# Patient Record
Sex: Female | Born: 1935 | Race: Black or African American | Hispanic: No | Smoking: Never smoker
Health system: Southern US, Community
[De-identification: ages and names within clinical notes are randomized; demographics above are authoritative.]

## PROBLEM LIST (undated history)

## (undated) DIAGNOSIS — I10 Essential (primary) hypertension: Secondary | ICD-10-CM

## (undated) DIAGNOSIS — H9190 Unspecified hearing loss, unspecified ear: Secondary | ICD-10-CM

## (undated) DIAGNOSIS — E7800 Pure hypercholesterolemia, unspecified: Secondary | ICD-10-CM

## (undated) DIAGNOSIS — E119 Type 2 diabetes mellitus without complications: Secondary | ICD-10-CM

## (undated) DIAGNOSIS — M1990 Unspecified osteoarthritis, unspecified site: Secondary | ICD-10-CM

## (undated) HISTORY — PX: ABDOMINAL HYSTERECTOMY: SHX81

## (undated) HISTORY — PX: CATARACT EXTRACTION W/ INTRAOCULAR LENS IMPLANT: SHX1309

## (undated) HISTORY — PX: COLONOSCOPY: SHX174

---

## 2005-04-24 ENCOUNTER — Ambulatory Visit: Payer: Self-pay | Admitting: Gastroenterology

## 2005-11-12 ENCOUNTER — Ambulatory Visit: Payer: Self-pay | Admitting: Family Medicine

## 2006-07-21 ENCOUNTER — Ambulatory Visit: Payer: Self-pay | Admitting: Family Medicine

## 2006-12-20 ENCOUNTER — Ambulatory Visit: Payer: Self-pay | Admitting: Family Medicine

## 2007-12-22 ENCOUNTER — Ambulatory Visit: Payer: Self-pay | Admitting: Internal Medicine

## 2008-04-26 ENCOUNTER — Ambulatory Visit: Payer: Self-pay | Admitting: Gastroenterology

## 2009-02-05 ENCOUNTER — Ambulatory Visit: Payer: Self-pay | Admitting: Internal Medicine

## 2009-03-03 ENCOUNTER — Ambulatory Visit: Payer: Self-pay | Admitting: Internal Medicine

## 2010-02-27 ENCOUNTER — Ambulatory Visit: Payer: Self-pay | Admitting: Family Medicine

## 2011-03-03 ENCOUNTER — Ambulatory Visit: Payer: Self-pay | Admitting: Family Medicine

## 2011-03-08 ENCOUNTER — Ambulatory Visit: Payer: Self-pay | Admitting: Family Medicine

## 2011-03-10 ENCOUNTER — Ambulatory Visit: Payer: Self-pay | Admitting: Family Medicine

## 2011-04-07 ENCOUNTER — Ambulatory Visit: Payer: Self-pay | Admitting: Family Medicine

## 2011-05-08 ENCOUNTER — Ambulatory Visit: Payer: Self-pay | Admitting: Family Medicine

## 2011-11-14 ENCOUNTER — Ambulatory Visit: Payer: Self-pay

## 2012-03-17 ENCOUNTER — Ambulatory Visit: Payer: Self-pay | Admitting: Family Medicine

## 2013-03-23 ENCOUNTER — Ambulatory Visit: Payer: Self-pay | Admitting: Family Medicine

## 2014-03-27 ENCOUNTER — Ambulatory Visit: Payer: Self-pay | Admitting: Family Medicine

## 2014-05-09 ENCOUNTER — Ambulatory Visit: Payer: Self-pay | Admitting: Ophthalmology

## 2015-01-24 ENCOUNTER — Ambulatory Visit: Payer: Self-pay | Admitting: Family Medicine

## 2015-04-04 ENCOUNTER — Ambulatory Visit
Admit: 2015-04-04 | Disposition: A | Discharge status: Home / Self Care | Payer: Self-pay | Attending: Family Medicine | Admitting: Family Medicine

## 2015-07-23 ENCOUNTER — Other Ambulatory Visit: Payer: Self-pay | Admitting: Family Medicine

## 2015-07-23 DIAGNOSIS — E785 Hyperlipidemia, unspecified: Secondary | ICD-10-CM

## 2015-07-30 ENCOUNTER — Other Ambulatory Visit: Payer: Self-pay | Admitting: Family Medicine

## 2015-07-30 DIAGNOSIS — E119 Type 2 diabetes mellitus without complications: Secondary | ICD-10-CM

## 2015-09-02 ENCOUNTER — Other Ambulatory Visit: Payer: Self-pay | Admitting: Family Medicine

## 2015-09-02 DIAGNOSIS — E119 Type 2 diabetes mellitus without complications: Secondary | ICD-10-CM

## 2015-09-26 ENCOUNTER — Other Ambulatory Visit: Payer: Self-pay | Admitting: Family Medicine

## 2015-10-30 ENCOUNTER — Other Ambulatory Visit: Payer: Self-pay | Admitting: Family Medicine

## 2015-11-25 ENCOUNTER — Other Ambulatory Visit: Payer: Self-pay | Admitting: Family Medicine

## 2015-12-23 ENCOUNTER — Other Ambulatory Visit: Payer: Self-pay | Admitting: Family Medicine

## 2016-01-03 ENCOUNTER — Other Ambulatory Visit: Payer: Self-pay | Admitting: Family Medicine

## 2016-04-15 ENCOUNTER — Encounter: Payer: Self-pay | Admitting: *Deleted

## 2016-04-16 NOTE — Discharge Instructions (Signed)

## 2016-04-20 ENCOUNTER — Encounter: Payer: Self-pay | Admitting: *Deleted

## 2016-04-20 ENCOUNTER — Ambulatory Visit: Payer: Commercial Managed Care - HMO | Admitting: Anesthesiology

## 2016-04-20 ENCOUNTER — Ambulatory Visit
Admission: RE | Admit: 2016-04-20 | Discharge: 2016-04-20 | Disposition: A | Discharge status: Home / Self Care | Payer: Commercial Managed Care - HMO | Source: Ambulatory Visit | Attending: Ophthalmology | Admitting: Ophthalmology

## 2016-04-20 ENCOUNTER — Encounter
Admission: RE | Disposition: A | Discharge status: Home / Self Care | Payer: Self-pay | Source: Ambulatory Visit | Attending: Ophthalmology

## 2016-04-20 DIAGNOSIS — H269 Unspecified cataract: Secondary | ICD-10-CM | POA: Diagnosis present

## 2016-04-20 DIAGNOSIS — Z7984 Long term (current) use of oral hypoglycemic drugs: Secondary | ICD-10-CM | POA: Insufficient documentation

## 2016-04-20 DIAGNOSIS — E119 Type 2 diabetes mellitus without complications: Secondary | ICD-10-CM | POA: Insufficient documentation

## 2016-04-20 DIAGNOSIS — M1990 Unspecified osteoarthritis, unspecified site: Secondary | ICD-10-CM | POA: Diagnosis not present

## 2016-04-20 DIAGNOSIS — Z90710 Acquired absence of both cervix and uterus: Secondary | ICD-10-CM | POA: Insufficient documentation

## 2016-04-20 DIAGNOSIS — Z79899 Other long term (current) drug therapy: Secondary | ICD-10-CM | POA: Insufficient documentation

## 2016-04-20 DIAGNOSIS — Z7982 Long term (current) use of aspirin: Secondary | ICD-10-CM | POA: Diagnosis not present

## 2016-04-20 DIAGNOSIS — I10 Essential (primary) hypertension: Secondary | ICD-10-CM | POA: Insufficient documentation

## 2016-04-20 DIAGNOSIS — Z9841 Cataract extraction status, right eye: Secondary | ICD-10-CM | POA: Diagnosis not present

## 2016-04-20 DIAGNOSIS — E7800 Pure hypercholesterolemia, unspecified: Secondary | ICD-10-CM | POA: Insufficient documentation

## 2016-04-20 DIAGNOSIS — H2512 Age-related nuclear cataract, left eye: Secondary | ICD-10-CM | POA: Insufficient documentation

## 2016-04-20 HISTORY — DX: Pure hypercholesterolemia, unspecified: E78.00

## 2016-04-20 HISTORY — DX: Unspecified osteoarthritis, unspecified site: M19.90

## 2016-04-20 HISTORY — DX: Essential (primary) hypertension: I10

## 2016-04-20 HISTORY — PX: CATARACT EXTRACTION W/PHACO: SHX586

## 2016-04-20 HISTORY — DX: Type 2 diabetes mellitus without complications: E11.9

## 2016-04-20 HISTORY — DX: Unspecified hearing loss, unspecified ear: H91.90

## 2016-04-20 LAB — GLUCOSE, CAPILLARY
GLUCOSE-CAPILLARY: 111 mg/dL — AB (ref 65–99)
GLUCOSE-CAPILLARY: 130 mg/dL — AB (ref 65–99)

## 2016-04-20 SURGERY — PHACOEMULSIFICATION, CATARACT, WITH IOL INSERTION
Anesthesia: Monitor Anesthesia Care | Laterality: Left | Wound class: Clean

## 2016-04-20 MED ORDER — TETRACAINE HCL 0.5 % OP SOLN
1.0000 [drp] | OPHTHALMIC | Status: DC | PRN
  Administered 2016-04-20: 1 [drp] via OPHTHALMIC

## 2016-04-20 MED ORDER — POVIDONE-IODINE 5 % OP SOLN
1.0000 "application " | OPHTHALMIC | Status: DC | PRN
  Administered 2016-04-20: 1 via OPHTHALMIC

## 2016-04-20 MED ORDER — FENTANYL CITRATE (PF) 100 MCG/2ML IJ SOLN
INTRAMUSCULAR | Status: DC | PRN
  Administered 2016-04-20: 50 ug via INTRAVENOUS

## 2016-04-20 MED ORDER — MIDAZOLAM HCL 2 MG/2ML IJ SOLN
INTRAMUSCULAR | Status: DC | PRN
  Administered 2016-04-20: 2 mg via INTRAVENOUS

## 2016-04-20 MED ORDER — BRIMONIDINE TARTRATE 0.2 % OP SOLN
OPHTHALMIC | Status: DC | PRN
  Administered 2016-04-20: 1 [drp] via OPHTHALMIC

## 2016-04-20 MED ORDER — TIMOLOL MALEATE 0.5 % OP SOLN
OPHTHALMIC | Status: DC | PRN
  Administered 2016-04-20: 1 [drp] via OPHTHALMIC

## 2016-04-20 MED ORDER — NA HYALUR & NA CHOND-NA HYALUR 0.4-0.35 ML IO KIT
PACK | INTRAOCULAR | Status: DC | PRN
  Administered 2016-04-20: 1 mL via INTRAOCULAR

## 2016-04-20 MED ORDER — LACTATED RINGERS IV SOLN: INTRAVENOUS | Status: DC

## 2016-04-20 MED ORDER — BSS IO SOLN
INTRAOCULAR | Status: DC | PRN
  Administered 2016-04-20: 125 mL via OPHTHALMIC

## 2016-04-20 MED ORDER — LIDOCAINE HCL (PF) 4 % IJ SOLN
INTRAOCULAR | Status: DC | PRN
  Administered 2016-04-20: 1 mL via OPHTHALMIC

## 2016-04-20 MED ORDER — ARMC OPHTHALMIC DILATING GEL
1.0000 "application " | OPHTHALMIC | Status: DC | PRN
  Administered 2016-04-20 (×2): 1 via OPHTHALMIC

## 2016-04-20 MED ORDER — CEFUROXIME OPHTHALMIC INJECTION 1 MG/0.1 ML
INJECTION | OPHTHALMIC | Status: DC | PRN
  Administered 2016-04-20: 0.1 mL via OPHTHALMIC

## 2016-04-20 MED ORDER — BACITRACIN 500 UNIT/GM OP OINT: TOPICAL_OINTMENT | OPHTHALMIC | Status: DC | PRN

## 2016-04-20 SURGICAL SUPPLY — 28 items
APPLICATOR COTTON TIP 3IN (MISCELLANEOUS) ×3 IMPLANT
CANNULA ANT/CHMB 27GA (MISCELLANEOUS) ×3 IMPLANT
DISSECTOR HYDRO NUCLEUS 50X22 (MISCELLANEOUS) ×3 IMPLANT
GLOVE BIO SURGEON STRL SZ7 (GLOVE) ×3 IMPLANT
GLOVE SURG LX 6.5 MICRO (GLOVE) ×2
GLOVE SURG LX STRL 6.5 MICRO (GLOVE) ×1 IMPLANT
GOWN STRL REUS W/ TWL LRG LVL3 (GOWN DISPOSABLE) ×2 IMPLANT
GOWN STRL REUS W/TWL LRG LVL3 (GOWN DISPOSABLE) ×4
LENS IOL TECNIS ITEC 27.5 (Intraocular Lens) ×3 IMPLANT
MARKER SKIN DUAL TIP RULER LAB (MISCELLANEOUS) ×3 IMPLANT
NEEDLE FILTER BLUNT 18X 1/2SAF (NEEDLE) ×2
NEEDLE FILTER BLUNT 18X1 1/2 (NEEDLE) ×1 IMPLANT
PACK CATARACT BRASINGTON (MISCELLANEOUS) ×3 IMPLANT
PACK EYE AFTER SURG (MISCELLANEOUS) ×3 IMPLANT
PACK OPTHALMIC (MISCELLANEOUS) ×3 IMPLANT
RING MALYGIN 7.0 (MISCELLANEOUS) IMPLANT
SOL BAL SALT 15ML (MISCELLANEOUS)
SOLUTION BAL SALT 15ML (MISCELLANEOUS) IMPLANT
SUT ETHILON 10-0 CS-B-6CS-B-6 (SUTURE) ×3
SUT VICRYL  9 0 (SUTURE)
SUT VICRYL 9 0 (SUTURE) IMPLANT
SUTURE EHLN 10-0 CS-B-6CS-B-6 (SUTURE) ×1 IMPLANT
SYR 3ML LL SCALE MARK (SYRINGE) ×3 IMPLANT
SYR TB 1ML LUER SLIP (SYRINGE) ×3 IMPLANT
WATER STERILE IRR 250ML POUR (IV SOLUTION) ×3 IMPLANT
WATER STERILE IRR 500ML POUR (IV SOLUTION) IMPLANT
WICK EYE OCUCEL (MISCELLANEOUS) IMPLANT
WIPE NON LINTING 3.25X3.25 (MISCELLANEOUS) ×3 IMPLANT

## 2016-04-20 NOTE — H&P (Signed)
H+P reviewed and is up to date, please see paper chart.  

## 2016-04-20 NOTE — Anesthesia Procedure Notes (Signed)
Procedure Name: MAC Performed by: Angeleigh Chiasson Pre-anesthesia Checklist: Patient identified, Emergency Drugs available, Suction available, Timeout performed and Patient being monitored Patient Re-evaluated:Patient Re-evaluated prior to inductionOxygen Delivery Method: Nasal cannula Placement Confirmation: positive ETCO2       

## 2016-04-20 NOTE — Op Note (Signed)
Date of Surgery: 04/20/2016  PREOPERATIVE DIAGNOSES: Visually significant nuclear sclerotic cataract, left eye.  POSTOPERATIVE DIAGNOSES: Same  PROCEDURES PERFORMED: Cataract extraction with intraocular lens implant, left eye.  SURGEON: Devin GoingAnita P. Vin, M.D.  ANESTHESIA: MAC and topical  IMPLANTS: PCB00 +27.5 D   Implant Name Type Inv. Item Serial No. Manufacturer Lot No. LRB No. Used  LENS IOL DIOP 27.5 - [id number] Intraocular Lens LENS IOL DIOP 27.5 [id number]   Left 1    COMPLICATIONS: None.  DESCRIPTION OF PROCEDURE: Therapeutic options were discussed with the patient preoperatively, including a discussion of risks and benefits of surgery. Informed consent was obtained. An IOL-Master and immersion biometry were used to take the lens measurements, and a dilated fundus exam was performed within 6 months of the surgical date.  The patient was premedicated and brought to the operating room and placed on the operating table in the supine position. After adequate anesthesia, the patient was prepped and draped in the usual sterile ophthalmic fashion. A wire lid speculum was inserted and the microscope was positioned. A Superblade was used to create a paracentesis site at the limbus and a small amount of dilute preservative free lidocaine was instilled into the anterior chamber, followed by dispersive viscoelastic. A clear corneal incision was created temporally using a 2.4 mm keratome blade. Capsulorrhexis was then performed. In situ phacoemulsification was performed.  Cortical material was removed with the irrigation-aspiration unit. Dispersive viscoelastic was instilled to open the capsular bag. A posterior chamber intraocular lens with the specifications above was inserted and positioned. Irrigation-aspiration was used to remove all viscoelastic. Cefuroxime 1cc was instilled into the anterior chamber, and the corneal incision was checked and found to be water tight. The eyelid speculum was  removed.  The operative eye was covered with protective goggles after instilling 1 drop of timolol and brimonidine. The patient tolerated the procedure well. There were no complications.

## 2016-04-20 NOTE — Transfer of Care (Signed)
Immediate Anesthesia Transfer of Care Note  Patient: Patricia Lara  Procedure(s) Performed: Procedure(s) with comments: CATARACT EXTRACTION PHACO AND INTRAOCULAR LENS PLACEMENT (IOC) (Left) - DIABETIC - oral meds cannot arrive before 8AM  Patient Location: PACU  Anesthesia Type: MAC  Level of Consciousness: awake, alert  and patient cooperative  Airway and Oxygen Therapy: Patient Spontanous Breathing and Patient connected to supplemental oxygen  Post-op Assessment: Post-op Vital signs reviewed, Patient's Cardiovascular Status Stable, Respiratory Function Stable, Patent Airway and No signs of Nausea or vomiting  Post-op Vital Signs: Reviewed and stable  Complications: No apparent anesthesia complications

## 2016-04-20 NOTE — Anesthesia Postprocedure Evaluation (Signed)
Anesthesia Post Note  Patient: Patricia Lara  Procedure(s) Performed: Procedure(s) (LRB): CATARACT EXTRACTION PHACO AND INTRAOCULAR LENS PLACEMENT (IOC) (Left)  Patient location during evaluation: PACU Anesthesia Type: MAC Level of consciousness: awake and alert Pain management: pain level controlled Vital Signs Assessment: post-procedure vital signs reviewed and stable Respiratory status: spontaneous breathing, nonlabored ventilation, respiratory function stable and patient connected to nasal cannula oxygen Cardiovascular status: stable and blood pressure returned to baseline Anesthetic complications: no    Yarel Kilcrease C

## 2016-04-20 NOTE — Anesthesia Preprocedure Evaluation (Addendum)
Anesthesia Evaluation  Patient identified by MRN, date of birth, ID band Patient awake    Reviewed: Allergy & Precautions, NPO status , Patient's Chart, lab work & pertinent test results  Airway Mallampati: II  TM Distance: >3 FB Neck ROM: Full    Dental no notable dental hx.    Pulmonary neg pulmonary ROS,    Pulmonary exam normal breath sounds clear to auscultation       Cardiovascular hypertension, Normal cardiovascular exam Rhythm:Regular Rate:Normal     Neuro/Psych negative neurological ROS  negative psych ROS   GI/Hepatic negative GI ROS, Neg liver ROS,   Endo/Other  negative endocrine ROSdiabetes  Renal/GU negative Renal ROS  negative genitourinary   Musculoskeletal  (+) Arthritis ,   Abdominal   Peds negative pediatric ROS (+)  Hematology negative hematology ROS (+)   Anesthesia Other Findings   Reproductive/Obstetrics negative OB ROS                            Anesthesia Physical Anesthesia Plan  ASA: II  Anesthesia Plan: MAC   Post-op Pain Management:    Induction: Intravenous  Airway Management Planned:   Additional Equipment:   Intra-op Plan:   Post-operative Plan: Extubation in OR  Informed Consent: I have reviewed the patients History and Physical, chart, labs and discussed the procedure including the risks, benefits and alternatives for the proposed anesthesia with the patient or authorized representative who has indicated his/her understanding and acceptance.   Dental advisory given  Plan Discussed with: CRNA  Anesthesia Plan Comments:         Anesthesia Quick Evaluation

## 2016-04-21 ENCOUNTER — Encounter: Payer: Self-pay | Admitting: Ophthalmology

## 2018-04-05 ENCOUNTER — Other Ambulatory Visit: Payer: Self-pay | Admitting: Nurse Practitioner

## 2018-04-05 DIAGNOSIS — Z1231 Encounter for screening mammogram for malignant neoplasm of breast: Secondary | ICD-10-CM

## 2018-05-10 ENCOUNTER — Ambulatory Visit
Admission: RE | Admit: 2018-05-10 | Discharge: 2018-05-10 | Disposition: A | Discharge status: Home / Self Care | Payer: Medicare HMO | Source: Ambulatory Visit | Attending: Nurse Practitioner | Admitting: Nurse Practitioner

## 2018-05-10 DIAGNOSIS — Z1231 Encounter for screening mammogram for malignant neoplasm of breast: Secondary | ICD-10-CM | POA: Insufficient documentation

## 2018-11-14 ENCOUNTER — Other Ambulatory Visit: Payer: Self-pay

## 2018-11-14 ENCOUNTER — Ambulatory Visit
Admission: EM | Admit: 2018-11-14 | Discharge: 2018-11-14 | Disposition: A | Discharge status: Home / Self Care | Payer: Medicare HMO | Attending: Family Medicine | Admitting: Family Medicine

## 2018-11-14 ENCOUNTER — Encounter: Payer: Self-pay | Admitting: Emergency Medicine

## 2018-11-14 DIAGNOSIS — L509 Urticaria, unspecified: Secondary | ICD-10-CM | POA: Diagnosis not present

## 2018-11-14 DIAGNOSIS — T7840XA Allergy, unspecified, initial encounter: Secondary | ICD-10-CM

## 2018-11-14 MED ORDER — CETIRIZINE HCL 10 MG PO TABS: 10.0000 mg | ORAL_TABLET | Freq: Every day | ORAL | 0 refills | Status: AC

## 2018-11-14 MED ORDER — FAMOTIDINE 20 MG PO TABS: 20.0000 mg | ORAL_TABLET | Freq: Two times a day (BID) | ORAL | 0 refills | Status: AC

## 2018-11-14 MED ORDER — PREDNISONE 10 MG PO TABS: ORAL_TABLET | ORAL | 0 refills | Status: AC

## 2018-11-14 MED ORDER — METHYLPREDNISOLONE SODIUM SUCC 40 MG IJ SOLR
80.0000 mg | Freq: Once | INTRAMUSCULAR | Status: AC
  Administered 2018-11-14: 80 mg via INTRAMUSCULAR

## 2018-11-14 NOTE — Discharge Instructions (Signed)
Medication as prescribed.  Start Prednisone tomorrow.  Hydroxyzine at night for itching if needed.  Stop Losartan for now.  Follow up with your doctor.  Take care  Dr. Adriana Simasook

## 2018-11-14 NOTE — ED Triage Notes (Signed)
Pt c/o itching, hives, lip and eye swelling. She was seen by her pcp and treated with prednisone and hydroxyzine. It was getting better then when she finished the prednisone it came back. Started back 3 days ago.

## 2018-11-14 NOTE — ED Provider Notes (Signed)
[facility]    CSN: [id number] Arrival date & time: 11/14/18  1133  History   Chief Complaint Chief Complaint  Patient presents with  . Pruritis   HPI  83 year old female presents with hives, itching, facial swelling, and lip swelling.  Patient was seen by her primary care physician on 12/2.  Diagnosed with urticaria.  Placed on Atarax and prednisone.  Patient states that she improved but never fully resolved.  Rash returned on Saturday.  Associated lip swelling, facial swelling, and itching.  No fever.  No known inciting factor.  She is on losartan.  Denies tongue swelling.  No reports of shortness of breath.  No other associated symptoms.  No other complaints.  Past Medical History:  Diagnosis Date  . Arthritis    ankles  . Diabetes mellitus without complication (HCC)   . HOH (hard of hearing)   . Hypercholesteremia   . Hypertension    Past Surgical History:  Procedure Laterality Date  . ABDOMINAL HYSTERECTOMY    . CATARACT EXTRACTION W/ INTRAOCULAR LENS IMPLANT Right   . CATARACT EXTRACTION W/PHACO Left 04/20/2016   Procedure: CATARACT EXTRACTION PHACO AND INTRAOCULAR LENS PLACEMENT (IOC);  Surgeon: Sherald HessAnita Prakash Vin-Parikh, MD;  [facility];  Service: Ophthalmology;  Laterality: Left;  DIABETIC - oral meds cannot arrive before 8AM  . COLONOSCOPY     OB History   None    Home Medications    Prior to Admission medications   Medication Sig Start Date End Date Taking? Authorizing Provider  ACCU-CHEK FASTCLIX LANCETS MISC TEST ONE TIME DAILY 07/31/15  Yes Duanne LimerickJones, Deanna C, MD  ACCU-CHEK SMARTVIEW test strip TEST ONE TIME DAILY 07/31/15  Yes Duanne LimerickJones, Deanna C, MD  amLODipine (NORVASC) 10 MG tablet Take 10 mg by mouth daily.   Yes [provider]  glipiZIDE (GLUCOTROL XL) 10 MG 24 hr tablet TAKE 1 TABLET EVERY DAY  (SCHEDULE  MD  APPOINTMENT) 11/04/15  Yes Duanne LimerickJones, Deanna C, MD  lovastatin (MEVACOR) 40 MG tablet TAKE 1 TABLET EVERY DAY   (NEED  MED  REFILL  APPOINTMENT) 11/26/15  Yes Duanne LimerickJones, Deanna C, MD  metFORMIN (GLUCOPHAGE) 1000 MG tablet Take 1,000 mg by mouth 2 (two) times daily with a meal.   Yes [provider]  Multiple Vitamin (MULTIVITAMIN) capsule Take 1 capsule by mouth daily.   Yes [provider]  cetirizine (ZYRTEC) 10 MG tablet Take 1 tablet (10 mg total) by mouth daily. 11/14/18   Tommie Samsook, Roselynn Whitacre G, DO  famotidine (PEPCID) 20 MG tablet Take 1 tablet (20 mg total) by mouth 2 (two) times daily. 11/14/18   Tommie Samsook, Pansey Pinheiro G, DO  predniSONE (DELTASONE) 10 MG tablet 50 mg daily x 2 days, then 40 mg daily x 2 days, then 30 mg daily x 2 days, then 20 mg daily x 2 days, then 10 mg daily x 2 days. 11/14/18   Tommie Samsook, Deven Furia G, DO   Family History Family History  Problem Relation Age of Onset  . Breast cancer Neg Hx    Social History Social History   Tobacco Use  . Smoking status: Never Smoker  . Smokeless tobacco: Never Used  Substance Use Topics  . Alcohol use: No  . Drug use: Never   Allergies   Patient has no known allergies.   Review of Systems Review of Systems  HENT: Positive for facial swelling.   Skin: Positive for rash.   Physical Exam Triage Vital Signs ED Triage Vitals  Enc Vitals  Group     BP 11/14/18 1205 135/79     Pulse Rate 11/14/18 1205 (!) 103     Resp 11/14/18 1205 18     Temp 11/14/18 1205 99.3 F (37.4 C)     Temp Source 11/14/18 1205 Oral     SpO2 11/14/18 1205 95 %     Weight 11/14/18 1201 196 lb (88.9 kg)     Height 11/14/18 1201 5\' 2"  (1.575 m)     Head Circumference --      Peak Flow --      Pain Score 11/14/18 1302 0     Pain Loc --      Pain Edu? --      Excl. in GC? --    Updated Vital Signs BP 135/79 (BP Location: Left Arm)   Pulse (!) 103   Temp 99.3 F (37.4 C) (Oral)   Resp 18   Ht 5\' 2"  (1.575 m)   Wt 88.9 kg   SpO2 95%   BMI 35.85 kg/m   Visual Acuity Right Eye Distance:   Left Eye Distance:   Bilateral Distance:    Right Eye Near:     Left Eye Near:    Bilateral Near:     Physical Exam  Constitutional: She is oriented to person, place, and time. She appears well-developed. No distress.  HENT:  Head: Normocephalic and atraumatic.  Mouth/Throat: Oropharynx is clear and moist.  Mild lip swelling noted.  Patient with swelling around the eyes, right greater than left.  Eyes: Conjunctivae are normal. Right eye exhibits no discharge. Left eye exhibits no discharge.  Pulmonary/Chest: Effort normal. She has no wheezes.  Neurological: She is alert and oriented to person, place, and time.  Skin:  Urticaria noted around the neck and of the left lower arm.  Psychiatric: She has a normal mood and affect. Her behavior is normal.  Nursing note and vitals reviewed.  UC Treatments / Results  Labs (all labs ordered are listed, but only abnormal results are displayed) Labs Reviewed - No data to display  EKG None  Radiology No results found.  Procedures Procedures (including critical care time)  Medications Ordered in UC Medications  methylPREDNISolone sodium succinate (SOLU-MEDROL) 40 mg/mL injection 80 mg (80 mg Intramuscular Given 11/14/18 1259)    Initial Impression / Assessment and Plan / UC Course  I have reviewed the triage vital signs and the nursing notes.  Pertinent labs & imaging results that were available during my care of the patient were reviewed by me and considered in my medical decision making (see chart for details).    83 year old female presents with urticaria/allergic reaction.  Treating with prednisone taper, Zyrtec, Pepcid.  Atarax at night as needed for itching.  Stop losartan for now.  Follow-up with PCP.  Final Clinical Impressions(s) / UC Diagnoses   Final diagnoses:  Urticaria  Allergic reaction, initial encounter     Discharge Instructions     Medication as prescribed.  Start Prednisone tomorrow.  Hydroxyzine at night for itching if needed.  Stop Losartan for now.  Follow up  with your doctor.  Take care  Dr. Adriana Simas    ED Prescriptions    Medication Sig Dispense Auth. Provider   predniSONE (DELTASONE) 10 MG tablet 50 mg daily x 2 days, then 40 mg daily x 2 days, then 30 mg daily x 2 days, then 20 mg daily x 2 days, then 10 mg daily x 2 days. 30 tablet Chauncey, [place], [place]  cetirizine (ZYRTEC) 10 MG tablet Take 1 tablet (10 mg total) by mouth daily. 30 tablet Leighana Neyman G, DO   famotidine (PEPCID) 20 MG tablet Take 1 tablet (20 mg total) by mouth 2 (two) times daily. 60 tablet Tommie Sams, DO     Controlled Substance Prescriptions [place] Controlled Substance Registry consulted? Not Applicable   Tommie Sams, DO 11/14/18 2123

## 2018-12-06 ENCOUNTER — Other Ambulatory Visit: Payer: Self-pay | Admitting: Family Medicine

## 2019-06-20 ENCOUNTER — Other Ambulatory Visit: Payer: Self-pay | Admitting: Nurse Practitioner

## 2019-06-20 DIAGNOSIS — Z1231 Encounter for screening mammogram for malignant neoplasm of breast: Secondary | ICD-10-CM

## 2019-07-05 ENCOUNTER — Other Ambulatory Visit: Payer: Self-pay

## 2019-07-05 ENCOUNTER — Ambulatory Visit
Admission: RE | Admit: 2019-07-05 | Discharge: 2019-07-05 | Disposition: A | Discharge status: Home / Self Care | Payer: Medicare HMO | Source: Ambulatory Visit | Attending: Nurse Practitioner | Admitting: Nurse Practitioner

## 2019-07-05 ENCOUNTER — Encounter (INDEPENDENT_AMBULATORY_CARE_PROVIDER_SITE_OTHER): Payer: Self-pay

## 2019-07-05 DIAGNOSIS — Z1231 Encounter for screening mammogram for malignant neoplasm of breast: Secondary | ICD-10-CM

## 2019-10-11 ENCOUNTER — Other Ambulatory Visit: Payer: Self-pay | Admitting: Nurse Practitioner

## 2019-10-11 DIAGNOSIS — R2990 Unspecified symptoms and signs involving the nervous system: Secondary | ICD-10-CM

## 2019-10-20 ENCOUNTER — Other Ambulatory Visit: Payer: Self-pay

## 2019-10-20 ENCOUNTER — Ambulatory Visit
Admission: RE | Admit: 2019-10-20 | Discharge: 2019-10-20 | Disposition: A | Discharge status: Home / Self Care | Payer: Medicare HMO | Source: Ambulatory Visit | Attending: Nurse Practitioner | Admitting: Nurse Practitioner

## 2019-10-20 DIAGNOSIS — R2990 Unspecified symptoms and signs involving the nervous system: Secondary | ICD-10-CM | POA: Insufficient documentation

## 2019-10-20 MED ORDER — GADOBUTROL 1 MMOL/ML IV SOLN
8.0000 mL | Freq: Once | INTRAVENOUS | Status: AC | PRN
  Administered 2019-10-20: 8 mL via INTRAVENOUS

## 2019-10-24 ENCOUNTER — Ambulatory Visit: Payer: Medicare HMO

## 2020-05-28 ENCOUNTER — Other Ambulatory Visit: Payer: Self-pay | Admitting: Nurse Practitioner

## 2020-05-28 DIAGNOSIS — Z1231 Encounter for screening mammogram for malignant neoplasm of breast: Secondary | ICD-10-CM

## 2020-06-04 ENCOUNTER — Ambulatory Visit: Payer: Medicare HMO

## 2020-07-08 ENCOUNTER — Encounter (INDEPENDENT_AMBULATORY_CARE_PROVIDER_SITE_OTHER): Payer: Self-pay

## 2020-07-08 ENCOUNTER — Ambulatory Visit
Admission: RE | Admit: 2020-07-08 | Discharge: 2020-07-08 | Disposition: A | Discharge status: Home / Self Care | Payer: Medicare HMO | Source: Ambulatory Visit | Attending: Nurse Practitioner | Admitting: Nurse Practitioner

## 2020-07-08 ENCOUNTER — Other Ambulatory Visit: Payer: Self-pay

## 2020-07-08 DIAGNOSIS — Z1231 Encounter for screening mammogram for malignant neoplasm of breast: Secondary | ICD-10-CM | POA: Insufficient documentation

## 2021-06-02 ENCOUNTER — Other Ambulatory Visit: Payer: Self-pay | Admitting: Nurse Practitioner

## 2021-06-02 DIAGNOSIS — Z1231 Encounter for screening mammogram for malignant neoplasm of breast: Secondary | ICD-10-CM

## 2021-07-09 ENCOUNTER — Other Ambulatory Visit: Payer: Self-pay

## 2021-07-09 ENCOUNTER — Ambulatory Visit
Admission: RE | Admit: 2021-07-09 | Discharge: 2021-07-09 | Disposition: A | Discharge status: Home / Self Care | Payer: Medicare HMO | Source: Ambulatory Visit | Attending: Nurse Practitioner | Admitting: Nurse Practitioner

## 2021-07-09 DIAGNOSIS — Z1231 Encounter for screening mammogram for malignant neoplasm of breast: Secondary | ICD-10-CM | POA: Diagnosis not present

## 2021-12-09 DIAGNOSIS — E669 Obesity, unspecified: Secondary | ICD-10-CM | POA: Diagnosis not present

## 2021-12-09 DIAGNOSIS — Z794 Long term (current) use of insulin: Secondary | ICD-10-CM | POA: Diagnosis not present

## 2021-12-09 DIAGNOSIS — E1122 Type 2 diabetes mellitus with diabetic chronic kidney disease: Secondary | ICD-10-CM | POA: Diagnosis not present

## 2021-12-09 DIAGNOSIS — H9193 Unspecified hearing loss, bilateral: Secondary | ICD-10-CM | POA: Diagnosis not present

## 2021-12-09 DIAGNOSIS — Z0000 Encounter for general adult medical examination without abnormal findings: Secondary | ICD-10-CM | POA: Diagnosis not present

## 2021-12-09 DIAGNOSIS — Z79899 Other long term (current) drug therapy: Secondary | ICD-10-CM | POA: Diagnosis not present

## 2021-12-09 DIAGNOSIS — I10 Essential (primary) hypertension: Secondary | ICD-10-CM | POA: Diagnosis not present

## 2021-12-09 DIAGNOSIS — E782 Mixed hyperlipidemia: Secondary | ICD-10-CM | POA: Diagnosis not present

## 2021-12-09 DIAGNOSIS — N1832 Chronic kidney disease, stage 3b: Secondary | ICD-10-CM | POA: Diagnosis not present

## 2021-12-09 DIAGNOSIS — Z780 Asymptomatic menopausal state: Secondary | ICD-10-CM | POA: Diagnosis not present

## 2021-12-09 DIAGNOSIS — Z13820 Encounter for screening for osteoporosis: Secondary | ICD-10-CM | POA: Diagnosis not present

## 2021-12-09 DIAGNOSIS — E1169 Type 2 diabetes mellitus with other specified complication: Secondary | ICD-10-CM | POA: Diagnosis not present

## 2021-12-16 DIAGNOSIS — H353131 Nonexudative age-related macular degeneration, bilateral, early dry stage: Secondary | ICD-10-CM | POA: Diagnosis not present

## 2021-12-16 DIAGNOSIS — Z961 Presence of intraocular lens: Secondary | ICD-10-CM | POA: Diagnosis not present

## 2021-12-16 DIAGNOSIS — E119 Type 2 diabetes mellitus without complications: Secondary | ICD-10-CM | POA: Diagnosis not present

## 2022-01-30 DIAGNOSIS — M8588 Other specified disorders of bone density and structure, other site: Secondary | ICD-10-CM | POA: Diagnosis not present

## 2022-07-23 ENCOUNTER — Other Ambulatory Visit: Payer: Self-pay | Admitting: Nurse Practitioner

## 2022-07-23 DIAGNOSIS — Z1231 Encounter for screening mammogram for malignant neoplasm of breast: Secondary | ICD-10-CM

## 2022-07-30 ENCOUNTER — Ambulatory Visit
Admission: RE | Admit: 2022-07-30 | Discharge: 2022-07-30 | Disposition: A | Discharge status: Home / Self Care | Payer: Medicare HMO | Source: Ambulatory Visit | Attending: Nurse Practitioner | Admitting: Nurse Practitioner

## 2022-07-30 DIAGNOSIS — Z1231 Encounter for screening mammogram for malignant neoplasm of breast: Secondary | ICD-10-CM | POA: Insufficient documentation

## 2022-09-04 DIAGNOSIS — R079 Chest pain, unspecified: Secondary | ICD-10-CM | POA: Diagnosis not present

## 2022-12-22 DIAGNOSIS — N1832 Chronic kidney disease, stage 3b: Secondary | ICD-10-CM | POA: Diagnosis not present

## 2022-12-22 DIAGNOSIS — E1122 Type 2 diabetes mellitus with diabetic chronic kidney disease: Secondary | ICD-10-CM | POA: Diagnosis not present

## 2023-01-01 DIAGNOSIS — E1165 Type 2 diabetes mellitus with hyperglycemia: Secondary | ICD-10-CM | POA: Diagnosis not present

## 2023-01-05 DIAGNOSIS — N1832 Chronic kidney disease, stage 3b: Secondary | ICD-10-CM | POA: Diagnosis not present

## 2023-01-05 DIAGNOSIS — E1122 Type 2 diabetes mellitus with diabetic chronic kidney disease: Secondary | ICD-10-CM | POA: Diagnosis not present

## 2023-02-01 DIAGNOSIS — E1165 Type 2 diabetes mellitus with hyperglycemia: Secondary | ICD-10-CM | POA: Diagnosis not present

## 2023-02-24 IMAGING — MG MM DIGITAL SCREENING BILAT W/ TOMO AND CAD
6 of 10 series · 6 of 30 positions shown · non-contrast
Comparison: Previous exam(s).

CLINICAL DATA: Screening.

EXAM:
DIGITAL SCREENING BILATERAL MAMMOGRAM WITH TOMOSYNTHESIS AND CAD
TECHNIQUE: Bilateral screening digital craniocaudal and mediolateral oblique
mammograms were obtained. Bilateral screening digital breast
tomosynthesis was performed. The images were evaluated with
computer-aided detection.

[R CC synth-2D]
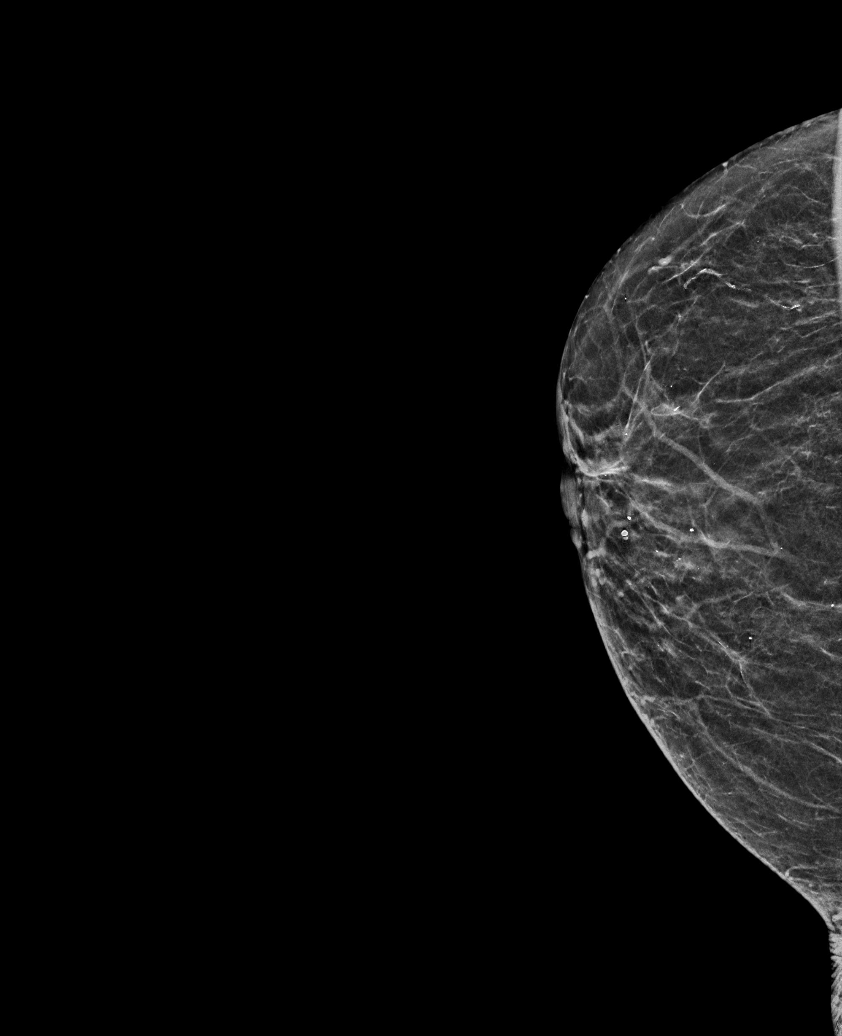

[L CC synth-2D]
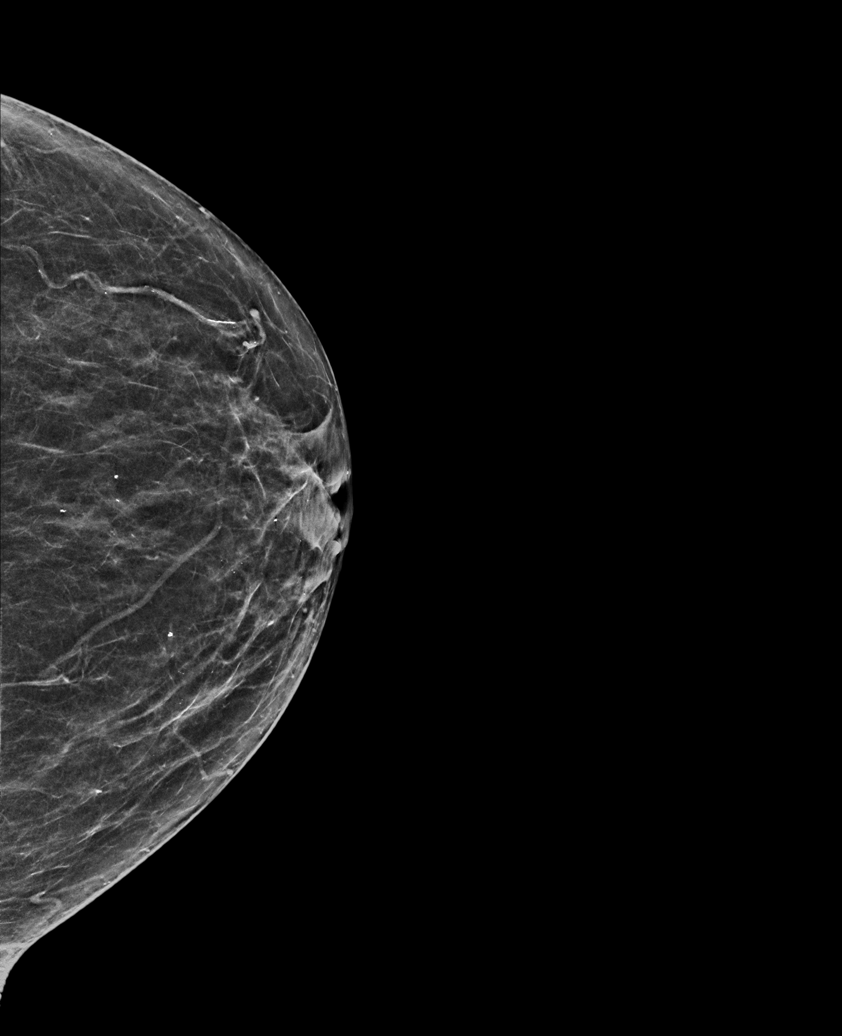

[L MLO synth-2D]
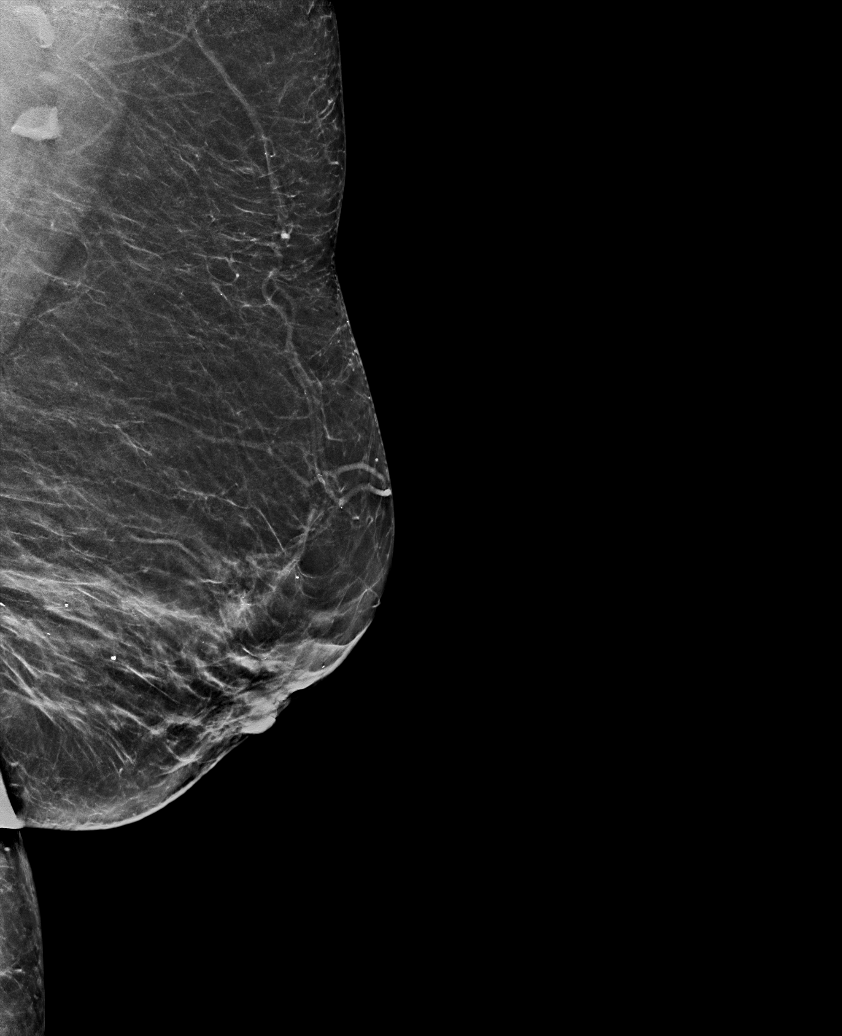

[R MLO synth-2D]
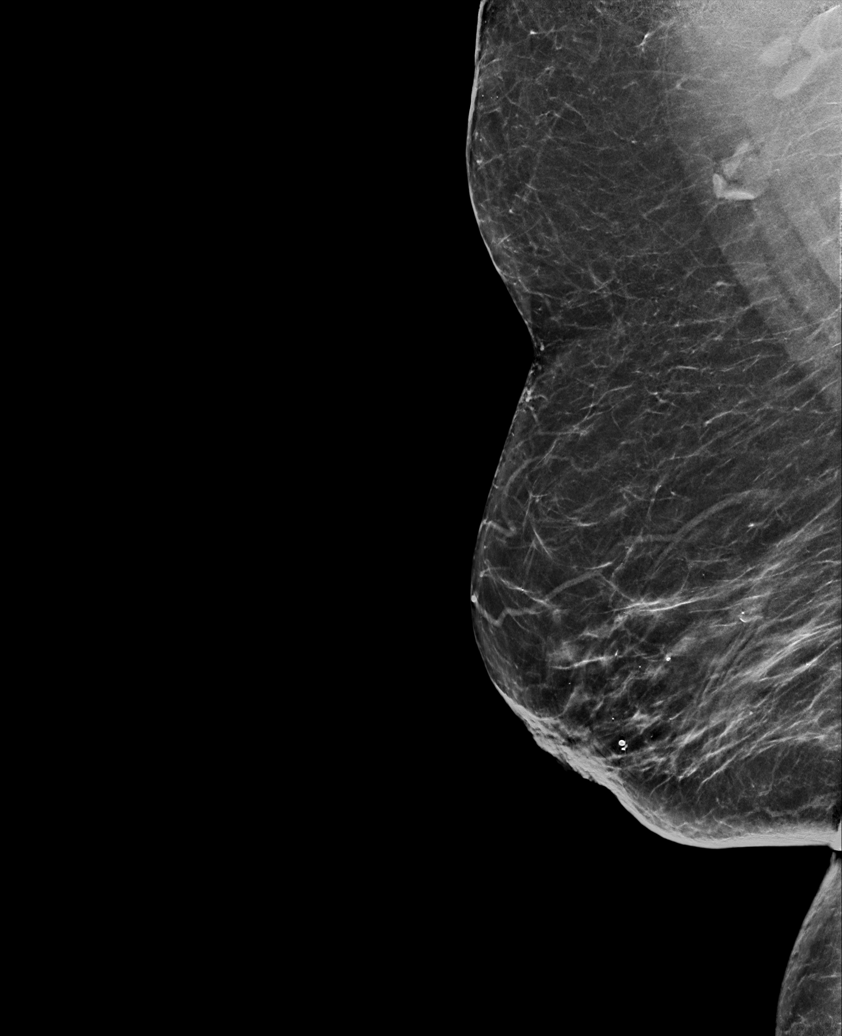

[R XCCL synth-2D]
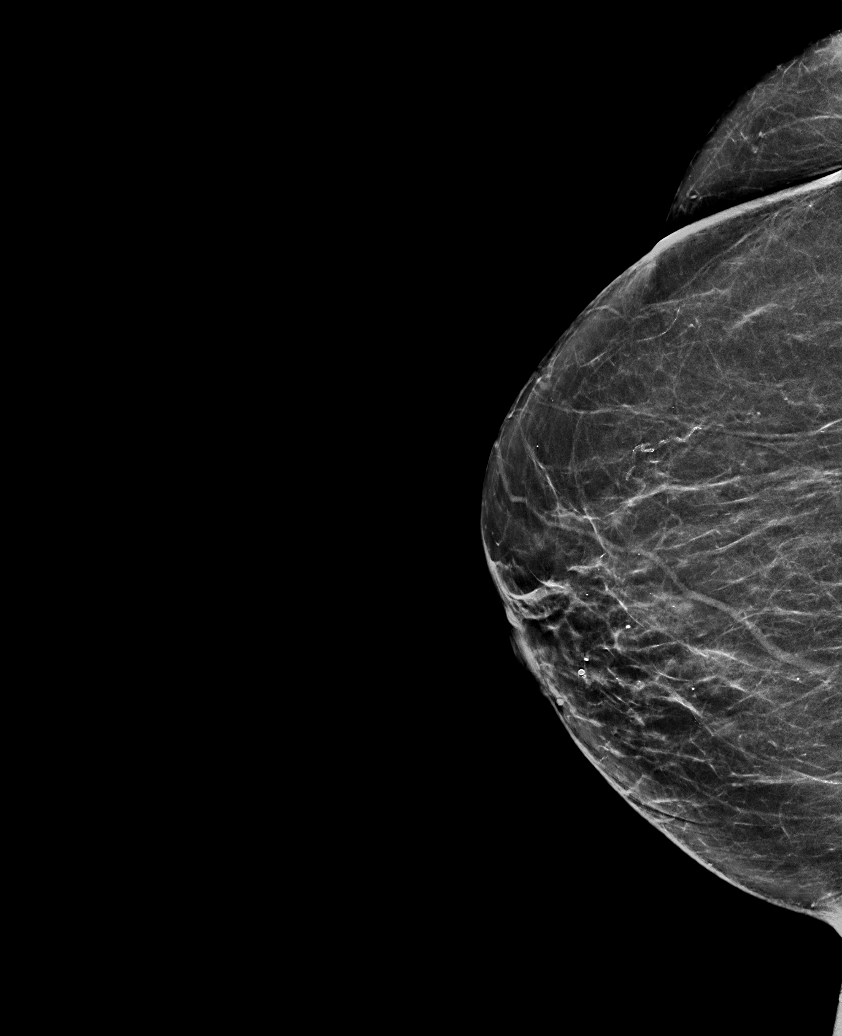

[R CC tomo · tomo slice 23/45.0]
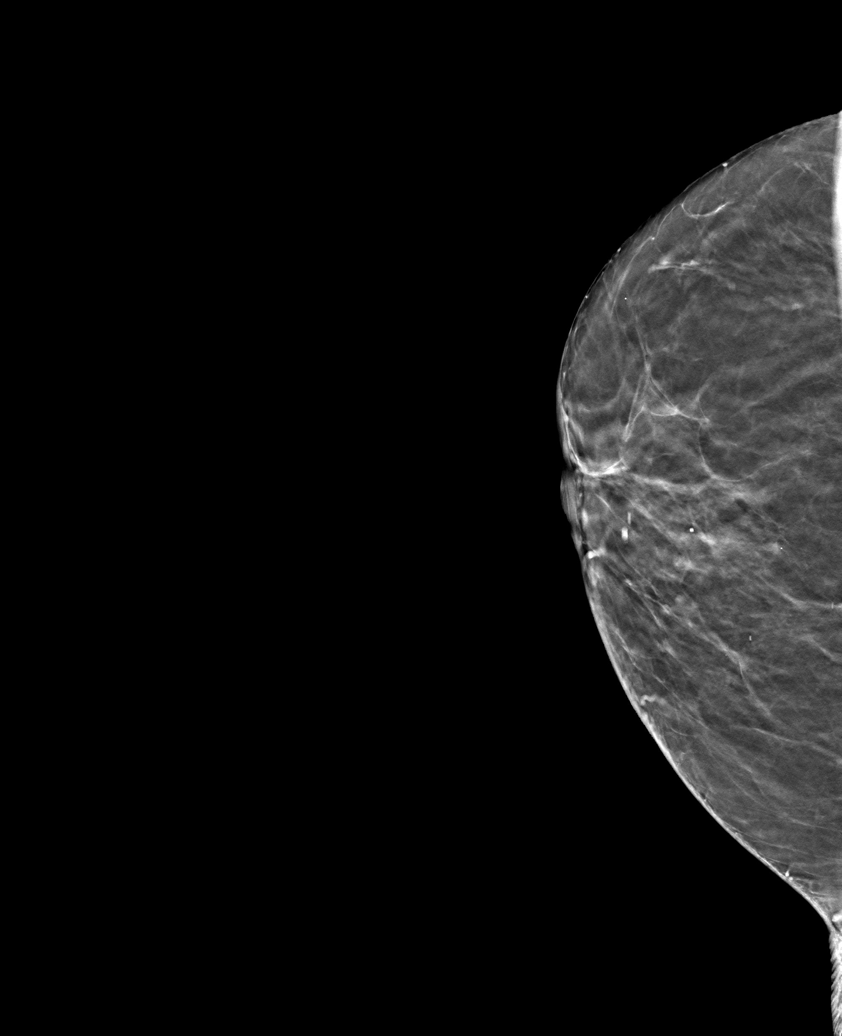

[6 of 30 positions shown; findings below may reference images not displayed]

ACR Breast Density Category b: There are scattered areas of
fibroglandular density.
FINDINGS: There are no findings suspicious for malignancy.
IMPRESSION: No mammographic evidence of malignancy. A result letter of this
screening mammogram will be mailed directly to the patient.

RECOMMENDATION:
Screening mammogram in one year. (Code:51-O-LD2)

BI-RADS CATEGORY  1: Negative.

## 2023-02-26 DIAGNOSIS — H43813 Vitreous degeneration, bilateral: Secondary | ICD-10-CM | POA: Diagnosis not present

## 2023-03-02 DIAGNOSIS — E1165 Type 2 diabetes mellitus with hyperglycemia: Secondary | ICD-10-CM | POA: Diagnosis not present

## 2023-03-10 DIAGNOSIS — G8929 Other chronic pain: Secondary | ICD-10-CM | POA: Diagnosis not present

## 2023-03-10 DIAGNOSIS — N183 Chronic kidney disease, stage 3 unspecified: Secondary | ICD-10-CM | POA: Diagnosis not present

## 2023-03-10 DIAGNOSIS — M1711 Unilateral primary osteoarthritis, right knee: Secondary | ICD-10-CM | POA: Diagnosis not present

## 2023-03-10 DIAGNOSIS — Z6832 Body mass index (BMI) 32.0-32.9, adult: Secondary | ICD-10-CM | POA: Diagnosis not present

## 2023-03-10 DIAGNOSIS — M25561 Pain in right knee: Secondary | ICD-10-CM | POA: Diagnosis not present

## 2023-03-10 DIAGNOSIS — E1122 Type 2 diabetes mellitus with diabetic chronic kidney disease: Secondary | ICD-10-CM | POA: Diagnosis not present

## 2023-04-02 DIAGNOSIS — E1165 Type 2 diabetes mellitus with hyperglycemia: Secondary | ICD-10-CM | POA: Diagnosis not present

## 2023-05-02 DIAGNOSIS — E1165 Type 2 diabetes mellitus with hyperglycemia: Secondary | ICD-10-CM | POA: Diagnosis not present

## 2023-06-02 DIAGNOSIS — E1165 Type 2 diabetes mellitus with hyperglycemia: Secondary | ICD-10-CM | POA: Diagnosis not present

## 2023-06-22 DIAGNOSIS — Z79899 Other long term (current) drug therapy: Secondary | ICD-10-CM | POA: Diagnosis not present

## 2023-06-22 DIAGNOSIS — E782 Mixed hyperlipidemia: Secondary | ICD-10-CM | POA: Diagnosis not present

## 2023-06-22 DIAGNOSIS — Z794 Long term (current) use of insulin: Secondary | ICD-10-CM | POA: Diagnosis not present

## 2023-06-22 DIAGNOSIS — I10 Essential (primary) hypertension: Secondary | ICD-10-CM | POA: Diagnosis not present

## 2023-06-22 DIAGNOSIS — Z978 Presence of other specified devices: Secondary | ICD-10-CM | POA: Diagnosis not present

## 2023-06-22 DIAGNOSIS — N1832 Chronic kidney disease, stage 3b: Secondary | ICD-10-CM | POA: Diagnosis not present

## 2023-06-22 DIAGNOSIS — E1159 Type 2 diabetes mellitus with other circulatory complications: Secondary | ICD-10-CM | POA: Diagnosis not present

## 2023-06-22 DIAGNOSIS — E1122 Type 2 diabetes mellitus with diabetic chronic kidney disease: Secondary | ICD-10-CM | POA: Diagnosis not present

## 2023-07-02 DIAGNOSIS — E1165 Type 2 diabetes mellitus with hyperglycemia: Secondary | ICD-10-CM | POA: Diagnosis not present

## 2023-07-13 ENCOUNTER — Other Ambulatory Visit: Payer: Self-pay | Admitting: Nurse Practitioner

## 2023-07-13 DIAGNOSIS — Z1231 Encounter for screening mammogram for malignant neoplasm of breast: Secondary | ICD-10-CM

## 2023-08-02 DIAGNOSIS — E1165 Type 2 diabetes mellitus with hyperglycemia: Secondary | ICD-10-CM | POA: Diagnosis not present

## 2023-08-11 ENCOUNTER — Ambulatory Visit
Admission: RE | Admit: 2023-08-11 | Discharge: 2023-08-11 | Disposition: A | Discharge status: Home / Self Care | Payer: Medicare HMO | Source: Ambulatory Visit | Attending: Nurse Practitioner | Admitting: Nurse Practitioner

## 2023-08-11 DIAGNOSIS — Z1231 Encounter for screening mammogram for malignant neoplasm of breast: Secondary | ICD-10-CM | POA: Insufficient documentation

## 2023-09-02 DIAGNOSIS — E1165 Type 2 diabetes mellitus with hyperglycemia: Secondary | ICD-10-CM | POA: Diagnosis not present

## 2023-10-02 DIAGNOSIS — E1165 Type 2 diabetes mellitus with hyperglycemia: Secondary | ICD-10-CM | POA: Diagnosis not present

## 2023-10-11 ENCOUNTER — Ambulatory Visit: Payer: Medicare HMO

## 2023-10-11 ENCOUNTER — Ambulatory Visit
Admission: EM | Admit: 2023-10-11 | Discharge: 2023-10-11 | Disposition: A | Discharge status: Home / Self Care | Payer: Medicare HMO | Attending: Physician Assistant | Admitting: Physician Assistant

## 2023-10-11 DIAGNOSIS — R0981 Nasal congestion: Secondary | ICD-10-CM

## 2023-10-11 DIAGNOSIS — R051 Acute cough: Secondary | ICD-10-CM | POA: Diagnosis not present

## 2023-10-11 DIAGNOSIS — J069 Acute upper respiratory infection, unspecified: Secondary | ICD-10-CM | POA: Diagnosis not present

## 2023-10-11 DIAGNOSIS — R059 Cough, unspecified: Secondary | ICD-10-CM | POA: Diagnosis not present

## 2023-10-11 DIAGNOSIS — Z1152 Encounter for screening for COVID-19: Secondary | ICD-10-CM | POA: Diagnosis not present

## 2023-10-11 LAB — SARS CORONAVIRUS 2 BY RT PCR: SARS Coronavirus 2 by RT PCR: NEGATIVE

## 2023-10-11 MED ORDER — IPRATROPIUM BROMIDE 0.06 % NA SOLN: 2.0000 | Freq: Four times a day (QID) | NASAL | 0 refills | Status: AC

## 2023-10-11 NOTE — Discharge Instructions (Signed)
-  COVID-negative. - I sent a nasal spray to the pharmacy.  Continue to cough medicine. - If the radiologist sees something abnormal on your chest x-ray such as pneumonia I will call and send antibiotics but I did not see any definite pneumonia. - Likely viral and will take 1 to 2 weeks to run its course. - If you develop fever, chest pain, shortness of breath please return for reevaluation.

## 2023-10-11 NOTE — ED Triage Notes (Signed)
Cough-congestion-body aches-flank pain that's worse when she coughes. X 4 days.

## 2023-10-11 NOTE — ED Provider Notes (Signed)
[facility]    CSN: [id number] Arrival date & time: 10/11/23  1105      History   Chief Complaint Chief Complaint  Patient presents with   Cough   Generalized Body Aches    HPI Patricia Lara is a 88 y.o. female presenting for approximately 4-day history of cough, congestion/runny nose, body aches, fatigue and headaches.  Denies fever, sore throat, chest pain, shortness of breath.  Feels a little achy.  No sick contacts.  Has been taking Coricidin HBP with some improvement in symptoms.  No other complaints.  HPI  Past Medical History:  Diagnosis Date   Arthritis    ankles   Diabetes mellitus without complication (HCC)    HOH (hard of hearing)    Hypercholesteremia    Hypertension     There are no problems to display for this patient.   Past Surgical History:  Procedure Laterality Date   ABDOMINAL HYSTERECTOMY     CATARACT EXTRACTION W/ INTRAOCULAR LENS IMPLANT Right    CATARACT EXTRACTION W/PHACO Left 04/20/2016   Procedure: CATARACT EXTRACTION PHACO AND INTRAOCULAR LENS PLACEMENT (IOC);  Surgeon: Sherald Hess, MD;  Location: [facility];  Service: Ophthalmology;  Laterality: Left;  DIABETIC - oral meds cannot arrive before 8AM   COLONOSCOPY      OB History   No obstetric history on file.      Home Medications    Prior to Admission medications   Medication Sig Start Date End Date Taking? Authorizing Provider  amLODipine (NORVASC) 10 MG tablet Take 10 mg by mouth daily.   Yes [provider]  hydrochlorothiazide (MICROZIDE) 12.5 MG capsule Take 12.5 mg by mouth every morning.   Yes [provider]  ipratropium (ATROVENT) 0.06 % nasal spray Place 2 sprays into both nostrils 4 (four) times daily. 10/11/23  Yes Eusebio Friendly B, PA-C  LANTUS SOLOSTAR 100 UNIT/ML Solostar Pen SMARTSIG:36 Unit(s) SUB-Q Daily   Yes [provider]  metFORMIN (GLUCOPHAGE) 1000 MG tablet Take 1,000 mg by mouth 2 (two)  times daily with a meal.   Yes [provider]  ACCU-CHEK FASTCLIX LANCETS MISC TEST ONE TIME DAILY 07/31/15   Duanne Limerick, MD  ACCU-CHEK SMARTVIEW test strip TEST ONE TIME DAILY 07/31/15   Duanne Limerick, MD  cetirizine (ZYRTEC) 10 MG tablet Take 1 tablet (10 mg total) by mouth daily. 11/14/18   Tommie Sams, DO  famotidine (PEPCID) 20 MG tablet Take 1 tablet (20 mg total) by mouth 2 (two) times daily. 11/14/18   Cook, Jayce G, DO  glipiZIDE (GLUCOTROL XL) 10 MG 24 hr tablet TAKE 1 TABLET EVERY DAY  (SCHEDULE  MD  APPOINTMENT) 11/04/15   Duanne Limerick, MD  lovastatin (MEVACOR) 40 MG tablet TAKE 1 TABLET EVERY DAY  (NEED  MED  REFILL  APPOINTMENT) 11/26/15   Duanne Limerick, MD  Multiple Vitamin (MULTIVITAMIN) capsule Take 1 capsule by mouth daily.    [provider]  predniSONE (DELTASONE) 10 MG tablet 50 mg daily x 2 days, then 40 mg daily x 2 days, then 30 mg daily x 2 days, then 20 mg daily x 2 days, then 10 mg daily x 2 days. 11/14/18   Tommie Sams, DO    Family History Family History  Problem Relation Age of Onset   Breast cancer Sister 92    Social History Social History   Tobacco Use   Smoking status: Never   Smokeless tobacco: Never  Vaping Use   Vaping status: Never Used  Substance Use Topics   Alcohol use: No   Drug use: Never     Allergies   Shellfish allergy   Review of Systems Review of Systems  Constitutional:  Positive for fatigue. Negative for chills, diaphoresis and fever.  HENT:  Positive for congestion and rhinorrhea. Negative for ear pain, sinus pressure, sinus pain and sore throat.   Respiratory:  Positive for cough. Negative for shortness of breath.   Cardiovascular:  Negative for chest pain.  Gastrointestinal:  Negative for abdominal pain, diarrhea, nausea and vomiting.  Musculoskeletal:  Positive for myalgias. Negative for arthralgias and back pain.  Skin:  Negative for rash.  Neurological:  Negative for weakness and  headaches.  Hematological:  Negative for adenopathy.     Physical Exam Triage Vital Signs ED Triage Vitals [10/11/23 1236]  Encounter Vitals Group     BP      Systolic BP Percentile      Diastolic BP Percentile      Pulse      Resp      Temp      Temp src      SpO2      Weight      Height      Head Circumference      Peak Flow      Pain Score 0     Pain Loc      Pain Education      Exclude from Growth Chart    No data found.  Updated Vital Signs BP (!) 161/95 (BP Location: Left Arm)   Pulse 100   Temp 98.3 F (36.8 C) (Oral)   Resp 20   SpO2 99%    Physical Exam Vitals and nursing note reviewed.  Constitutional:      General: She is not in acute distress.    Appearance: Normal appearance. She is not ill-appearing or toxic-appearing.  HENT:     Head: Normocephalic and atraumatic.     Nose: Congestion present.     Mouth/Throat:     Mouth: Mucous membranes are moist.     Pharynx: Oropharynx is clear.  Eyes:     General: No scleral icterus.       Right eye: No discharge.        Left eye: No discharge.     Conjunctiva/sclera: Conjunctivae normal.  Cardiovascular:     Rate and Rhythm: Normal rate and regular rhythm.     Heart sounds: Normal heart sounds.  Pulmonary:     Effort: Pulmonary effort is normal. No respiratory distress.     Breath sounds: Normal breath sounds.  Musculoskeletal:     Cervical back: Neck supple.  Skin:    General: Skin is dry.  Neurological:     General: No focal deficit present.     Mental Status: She is alert. Mental status is at baseline.     Motor: No weakness.     Gait: Gait normal.  Psychiatric:        Mood and Affect: Mood normal.      UC Treatments / Results  Labs (all labs ordered are listed, but only abnormal results are displayed) Labs Reviewed  SARS CORONAVIRUS 2 BY RT PCR    EKG   Radiology No results found.  Procedures Procedures (including critical care time)  Medications Ordered in  UC Medications - No data to display  Initial Impression / Assessment and Plan / UC Course  I  have reviewed the triage vital signs and the nursing notes.  Pertinent labs & imaging results that were available during my care of the patient were reviewed by me and considered in my medical decision making (see chart for details).   88 year old female presents with her son for cough, congestion/runny nose, fatigue and aches x 4 days.  Patient is afebrile and overall well-appearing.  On exam has nasal congestion.  Throat is clear.  Chest clear auscultation.  COVID test negative.  Chest x-ray obtained to assess for possible pneumonia. Negative.   Sent Atrovent nasal spray to pharmacy encourage increasing rest and fluids and continuing over-the-counter cough medicine.  Reviewed returning if fever, chest pain, shortness of breath or any worsening symptoms.   Final Clinical Impressions(s) / UC Diagnoses   Final diagnoses:  Viral upper respiratory tract infection  Acute cough  Nasal congestion     Discharge Instructions      -COVID-negative. - I sent a nasal spray to the pharmacy.  Continue to cough medicine. - If the radiologist sees something abnormal on your chest x-ray such as pneumonia I will call and send antibiotics but I did not see any definite pneumonia. - Likely viral and will take 1 to 2 weeks to run its course. - If you develop fever, chest pain, shortness of breath please return for reevaluation.     ED Prescriptions     Medication Sig Dispense Auth. Provider   ipratropium (ATROVENT) 0.06 % nasal spray Place 2 sprays into both nostrils 4 (four) times daily. 15 mL Shirlee Latch, PA-C      PDMP not reviewed this encounter.   Shirlee Latch, PA-C 10/11/23 1552

## 2023-11-02 DIAGNOSIS — E1165 Type 2 diabetes mellitus with hyperglycemia: Secondary | ICD-10-CM | POA: Diagnosis not present

## 2023-12-02 DIAGNOSIS — E1165 Type 2 diabetes mellitus with hyperglycemia: Secondary | ICD-10-CM | POA: Diagnosis not present

## 2024-01-02 DIAGNOSIS — E1165 Type 2 diabetes mellitus with hyperglycemia: Secondary | ICD-10-CM | POA: Diagnosis not present

## 2024-01-05 DIAGNOSIS — E1122 Type 2 diabetes mellitus with diabetic chronic kidney disease: Secondary | ICD-10-CM | POA: Diagnosis not present

## 2024-01-05 DIAGNOSIS — N1832 Chronic kidney disease, stage 3b: Secondary | ICD-10-CM | POA: Diagnosis not present

## 2024-02-02 DIAGNOSIS — E1165 Type 2 diabetes mellitus with hyperglycemia: Secondary | ICD-10-CM | POA: Diagnosis not present

## 2024-02-04 DIAGNOSIS — Z7984 Long term (current) use of oral hypoglycemic drugs: Secondary | ICD-10-CM | POA: Diagnosis not present

## 2024-02-04 DIAGNOSIS — R32 Unspecified urinary incontinence: Secondary | ICD-10-CM | POA: Diagnosis not present

## 2024-02-04 DIAGNOSIS — I10 Essential (primary) hypertension: Secondary | ICD-10-CM | POA: Diagnosis not present

## 2024-02-04 DIAGNOSIS — Z809 Family history of malignant neoplasm, unspecified: Secondary | ICD-10-CM | POA: Diagnosis not present

## 2024-02-04 DIAGNOSIS — Z008 Encounter for other general examination: Secondary | ICD-10-CM | POA: Diagnosis not present

## 2024-02-04 DIAGNOSIS — E785 Hyperlipidemia, unspecified: Secondary | ICD-10-CM | POA: Diagnosis not present

## 2024-02-04 DIAGNOSIS — H3530 Unspecified macular degeneration: Secondary | ICD-10-CM | POA: Diagnosis not present

## 2024-02-04 DIAGNOSIS — E669 Obesity, unspecified: Secondary | ICD-10-CM | POA: Diagnosis not present

## 2024-02-04 DIAGNOSIS — E1151 Type 2 diabetes mellitus with diabetic peripheral angiopathy without gangrene: Secondary | ICD-10-CM | POA: Diagnosis not present

## 2024-02-04 DIAGNOSIS — Z794 Long term (current) use of insulin: Secondary | ICD-10-CM | POA: Diagnosis not present

## 2024-02-04 DIAGNOSIS — Z833 Family history of diabetes mellitus: Secondary | ICD-10-CM | POA: Diagnosis not present

## 2024-02-04 DIAGNOSIS — M8580 Other specified disorders of bone density and structure, unspecified site: Secondary | ICD-10-CM | POA: Diagnosis not present

## 2024-02-04 DIAGNOSIS — Z8249 Family history of ischemic heart disease and other diseases of the circulatory system: Secondary | ICD-10-CM | POA: Diagnosis not present

## 2024-02-29 DIAGNOSIS — H43813 Vitreous degeneration, bilateral: Secondary | ICD-10-CM | POA: Diagnosis not present

## 2024-02-29 DIAGNOSIS — E119 Type 2 diabetes mellitus without complications: Secondary | ICD-10-CM | POA: Diagnosis not present

## 2024-02-29 DIAGNOSIS — H04123 Dry eye syndrome of bilateral lacrimal glands: Secondary | ICD-10-CM | POA: Diagnosis not present

## 2024-02-29 DIAGNOSIS — Z961 Presence of intraocular lens: Secondary | ICD-10-CM | POA: Diagnosis not present

## 2024-03-01 DIAGNOSIS — E1165 Type 2 diabetes mellitus with hyperglycemia: Secondary | ICD-10-CM | POA: Diagnosis not present

## 2024-04-01 DIAGNOSIS — E1165 Type 2 diabetes mellitus with hyperglycemia: Secondary | ICD-10-CM | POA: Diagnosis not present

## 2024-05-01 DIAGNOSIS — E1165 Type 2 diabetes mellitus with hyperglycemia: Secondary | ICD-10-CM | POA: Diagnosis not present

## 2024-05-25 ENCOUNTER — Ambulatory Visit
Admission: EM | Admit: 2024-05-25 | Discharge: 2024-05-25 | Disposition: A | Discharge status: Home / Self Care | Attending: Family Medicine | Admitting: Family Medicine

## 2024-05-25 ENCOUNTER — Encounter: Payer: Self-pay | Admitting: Emergency Medicine

## 2024-05-25 ENCOUNTER — Ambulatory Visit (INDEPENDENT_AMBULATORY_CARE_PROVIDER_SITE_OTHER)

## 2024-05-25 DIAGNOSIS — J180 Bronchopneumonia, unspecified organism: Secondary | ICD-10-CM

## 2024-05-25 DIAGNOSIS — R059 Cough, unspecified: Secondary | ICD-10-CM | POA: Diagnosis not present

## 2024-05-25 DIAGNOSIS — R0602 Shortness of breath: Secondary | ICD-10-CM

## 2024-05-25 DIAGNOSIS — R918 Other nonspecific abnormal finding of lung field: Secondary | ICD-10-CM | POA: Diagnosis not present

## 2024-05-25 LAB — RESP PANEL BY RT-PCR (RSV, FLU A&B, COVID)  RVPGX2
Influenza A by PCR: NEGATIVE
Influenza B by PCR: NEGATIVE
Resp Syncytial Virus by PCR: NEGATIVE
SARS Coronavirus 2 by RT PCR: NEGATIVE

## 2024-05-25 MED ORDER — CEFDINIR 300 MG PO CAPS: 300.0000 mg | ORAL_CAPSULE | Freq: Two times a day (BID) | ORAL | 0 refills | Status: AC

## 2024-05-25 MED ORDER — PROMETHAZINE-DM 6.25-15 MG/5ML PO SYRP: 5.0000 mL | ORAL_SOLUTION | Freq: Four times a day (QID) | ORAL | 0 refills | Status: AC | PRN

## 2024-05-25 MED ORDER — BENZONATATE 100 MG PO CAPS: 100.0000 mg | ORAL_CAPSULE | Freq: Three times a day (TID) | ORAL | 0 refills | Status: AC

## 2024-05-25 MED ORDER — AZITHROMYCIN 250 MG PO TABS: ORAL_TABLET | ORAL | 0 refills | Status: AC

## 2024-05-25 NOTE — Discharge Instructions (Signed)
 You have pneumonia. Stop by the pharmacy to pick up your prescriptions.  Follow up with your primary care provider or return to the urgent care, if not improving.

## 2024-05-25 NOTE — ED Provider Notes (Incomplete)
 [facility]    CSN: [id number] Arrival date & time: 05/25/24  0931      History   Chief Complaint Chief Complaint  Patient presents with  . Cough  . Nasal Congestion    HPI Patricia Lara is a 89 y.o. female.   HPI  History obtained from the patient and her son. Patricia Lara presents for cough, rhinorrhea,  nasal congestion and soreness in abdomen due to excessive cough. Symptoms started 3 days ago. Has been sleeping straight up due to the cough and drainage. Denies fever, vomiting, diarrhea and sore throat. No known sick contacts.   No history of asthma. Denies vaping and smoking.   Past Medical History:  Diagnosis Date  . Arthritis    ankles  . Diabetes mellitus without complication (HCC)   . HOH (hard of hearing)   . Hypercholesteremia   . Hypertension     There are no active problems to display for this patient.   Past Surgical History:  Procedure Laterality Date  . ABDOMINAL HYSTERECTOMY    . CATARACT EXTRACTION W/ INTRAOCULAR LENS IMPLANT Right   . CATARACT EXTRACTION W/PHACO Left 04/20/2016   Procedure: CATARACT EXTRACTION PHACO AND INTRAOCULAR LENS PLACEMENT (IOC);  Surgeon: Billee Buddle, MD;  Location: [facility];  Service: Ophthalmology;  Laterality: Left;  DIABETIC - oral meds cannot arrive before 8AM  . COLONOSCOPY      OB History   No obstetric history on file.      Home Medications    Prior to Admission medications   Medication Sig Start Date End Date Taking? Authorizing Provider  losartan (COZAAR) 25 MG tablet Take 25 mg by mouth daily. 01/31/24  Yes [provider]  traZODone (DESYREL) 50 MG tablet TAKE 0.5-1 TABLETS BY MOUTH AT BEDTIME AS NEEDED FOR SLEEP. 02/16/24  Yes [provider]  ACCU-CHEK FASTCLIX LANCETS MISC TEST ONE TIME DAILY 07/31/15   Clarise Crooks, MD  ACCU-CHEK SMARTVIEW test strip TEST ONE TIME DAILY 07/31/15   Jones, Deanna C, MD  amLODipine (NORVASC) 10 MG tablet  Take 10 mg by mouth daily.    [provider]  cetirizine  (ZYRTEC ) 10 MG tablet Take 1 tablet (10 mg total) by mouth daily. 11/14/18   Cook, Jayce G, DO  famotidine  (PEPCID ) 20 MG tablet Take 1 tablet (20 mg total) by mouth 2 (two) times daily. 11/14/18   Cook, Jayce G, DO  glipiZIDE (GLUCOTROL XL) 10 MG 24 hr tablet TAKE 1 TABLET EVERY DAY  (SCHEDULE  MD  APPOINTMENT) 11/04/15   Jones, Deanna C, MD  hydrochlorothiazide (MICROZIDE) 12.5 MG capsule Take 12.5 mg by mouth every morning.    [provider]  ipratropium (ATROVENT ) 0.06 % nasal spray Place 2 sprays into both nostrils 4 (four) times daily. 10/11/23   Nancy Axon B, PA-C  LANTUS SOLOSTAR 100 UNIT/ML Solostar Pen SMARTSIG:36 Unit(s) SUB-Q Daily    [provider]  lovastatin (MEVACOR) 40 MG tablet TAKE 1 TABLET EVERY DAY  (NEED  MED  REFILL  APPOINTMENT) 11/26/15   Jones, Deanna C, MD  metFORMIN (GLUCOPHAGE) 1000 MG tablet Take 1,000 mg by mouth 2 (two) times daily with a meal.    [provider]  Multiple Vitamin (MULTIVITAMIN) capsule Take 1 capsule by mouth daily.    [provider]  predniSONE  (DELTASONE ) 10 MG tablet 50 mg daily x 2 days, then 40 mg daily x 2 days, then 30 mg daily x 2 days, then 20 mg daily  x 2 days, then 10 mg daily x 2 days. 11/14/18   Cook, Jayce G, DO    Family History Family History  Problem Relation Age of Onset  . Breast cancer Sister 38    Social History Social History   Tobacco Use  . Smoking status: Never  . Smokeless tobacco: Never  Vaping Use  . Vaping status: Never Used  Substance Use Topics  . Alcohol use: No  . Drug use: Never     Allergies   Shellfish allergy   Review of Systems Review of Systems: negative unless otherwise stated in HPI.      Physical Exam Triage Vital Signs ED Triage Vitals  Encounter Vitals Group     BP 05/25/24 1012 (!) 150/78     Girls Systolic BP Percentile --      Girls Diastolic BP Percentile --      Boys  Systolic BP Percentile --      Boys Diastolic BP Percentile --      Pulse Rate 05/25/24 1012 87     Resp 05/25/24 1012 16     Temp 05/25/24 1012 98.3 F (36.8 C)     Temp Source 05/25/24 1012 Oral     SpO2 05/25/24 1012 94 %     Weight --      Height --      Head Circumference --      Peak Flow --      Pain Score 05/25/24 1011 0     Pain Loc --      Pain Education --      Exclude from Growth Chart --    No data found.  Updated Vital Signs BP (!) 150/78 (BP Location: Left Arm)   Pulse 87   Temp 98.3 F (36.8 C) (Oral)   Resp 16   SpO2 94%   Visual Acuity Right Eye Distance:   Left Eye Distance:   Bilateral Distance:    Right Eye Near:   Left Eye Near:    Bilateral Near:     Physical Exam GEN:     alert, non-toxic appearing elderly female in no distress   HENT:  mucus membranes moist, oropharyngeal ***without lesions or ***erythema, no*** tonsillar hypertrophy or exudates, *** moderate erythematous edematous turbinates, ***clear nasal discharge, ***bilateral TM normal EYES:   pupils equal and reactive, ***no scleral injection or discharge NECK:  normal ROM, no ***lymphadenopathy, ***no meningismus   RESP:  no increased work of breathing, ***clear to auscultation bilaterally CVS:   regular rate ***and rhythm Skin:   warm and dry, no rash on visible skin***    UC Treatments / Results  Labs (all labs ordered are listed, but only abnormal results are displayed) Labs Reviewed  RESP PANEL BY RT-PCR (RSV, FLU A&B, COVID)  [id number]    EKG   Radiology No results found.  Procedures Procedures (including critical care time)  Medications Ordered in UC Medications - No data to display  Initial Impression / Assessment and Plan / UC Course  I have reviewed the triage vital signs and the nursing notes.  Pertinent labs & imaging results that were available during my care of the patient were reviewed by me and considered in my medical decision making (see chart for  details).       Pt is a 89 y.o. female who presents for *** days of respiratory symptoms. Patricia Lara is ***afebrile here without recent antipyretics. Satting well on room air. Overall pt is ***non-toxic appearing, well hydrated,  without respiratory distress. Pulmonary exam ***is unremarkable.  COVID and influenza panel obtained ***and was negative. ***Pt to quarantine until COVID test results or longer if positive.  I will call patient with test results, if positive. History consistent with ***viral respiratory illness. Discussed symptomatic treatment.  Explained lack of efficacy of antibiotics in viral disease.  Typical duration of symptoms discussed.   Return and ED precautions given and voiced understanding. Discussed MDM, treatment plan and plan for follow-up with patient*** who agrees with plan.     Final Clinical Impressions(s) / UC Diagnoses   Final diagnoses:  None   Discharge Instructions   None    ED Prescriptions   None    PDMP not reviewed this encounter.

## 2024-05-25 NOTE — ED Triage Notes (Signed)
 Pt presents with a dry cough and congestion x 3 days. Pt has taken OTC cold medication for relief.

## 2024-05-29 DIAGNOSIS — Z8701 Personal history of pneumonia (recurrent): Secondary | ICD-10-CM | POA: Diagnosis not present

## 2024-05-29 DIAGNOSIS — R053 Chronic cough: Secondary | ICD-10-CM | POA: Diagnosis not present

## 2024-06-01 DIAGNOSIS — E1165 Type 2 diabetes mellitus with hyperglycemia: Secondary | ICD-10-CM | POA: Diagnosis not present

## 2024-07-01 DIAGNOSIS — E1165 Type 2 diabetes mellitus with hyperglycemia: Secondary | ICD-10-CM | POA: Diagnosis not present

## 2024-07-05 ENCOUNTER — Encounter: Payer: Self-pay | Admitting: Nurse Practitioner

## 2024-07-05 ENCOUNTER — Other Ambulatory Visit: Payer: Self-pay | Admitting: Nurse Practitioner

## 2024-07-05 DIAGNOSIS — Z1231 Encounter for screening mammogram for malignant neoplasm of breast: Secondary | ICD-10-CM

## 2024-07-10 DIAGNOSIS — E785 Hyperlipidemia, unspecified: Secondary | ICD-10-CM | POA: Diagnosis not present

## 2024-07-10 DIAGNOSIS — N1832 Chronic kidney disease, stage 3b: Secondary | ICD-10-CM | POA: Diagnosis not present

## 2024-07-10 DIAGNOSIS — N189 Chronic kidney disease, unspecified: Secondary | ICD-10-CM | POA: Diagnosis not present

## 2024-07-10 DIAGNOSIS — Z1331 Encounter for screening for depression: Secondary | ICD-10-CM | POA: Diagnosis not present

## 2024-07-10 DIAGNOSIS — E1122 Type 2 diabetes mellitus with diabetic chronic kidney disease: Secondary | ICD-10-CM | POA: Diagnosis not present

## 2024-07-10 DIAGNOSIS — E11649 Type 2 diabetes mellitus with hypoglycemia without coma: Secondary | ICD-10-CM | POA: Diagnosis not present

## 2024-07-10 DIAGNOSIS — Z978 Presence of other specified devices: Secondary | ICD-10-CM | POA: Diagnosis not present

## 2024-07-10 DIAGNOSIS — Z794 Long term (current) use of insulin: Secondary | ICD-10-CM | POA: Diagnosis not present

## 2024-08-01 DIAGNOSIS — E1165 Type 2 diabetes mellitus with hyperglycemia: Secondary | ICD-10-CM | POA: Diagnosis not present

## 2024-08-01 DIAGNOSIS — H43813 Vitreous degeneration, bilateral: Secondary | ICD-10-CM | POA: Diagnosis not present

## 2024-08-15 ENCOUNTER — Ambulatory Visit
Admission: RE | Admit: 2024-08-15 | Discharge: 2024-08-15 | Disposition: A | Discharge status: Home / Self Care | Source: Ambulatory Visit | Attending: Nurse Practitioner | Admitting: Nurse Practitioner

## 2024-08-15 DIAGNOSIS — Z1231 Encounter for screening mammogram for malignant neoplasm of breast: Secondary | ICD-10-CM | POA: Insufficient documentation

## 2024-09-01 DIAGNOSIS — E1165 Type 2 diabetes mellitus with hyperglycemia: Secondary | ICD-10-CM | POA: Diagnosis not present

## 2024-10-01 DIAGNOSIS — E1165 Type 2 diabetes mellitus with hyperglycemia: Secondary | ICD-10-CM | POA: Diagnosis not present

## 2024-11-01 DIAGNOSIS — E1165 Type 2 diabetes mellitus with hyperglycemia: Secondary | ICD-10-CM | POA: Diagnosis not present

## 2024-12-01 DIAGNOSIS — E1165 Type 2 diabetes mellitus with hyperglycemia: Secondary | ICD-10-CM | POA: Diagnosis not present
